# Patient Record
Sex: Male | Born: 2000 | Hispanic: No | Marital: Single | Smoking: Never smoker
Health system: Southern US, Community
[De-identification: ages and names within clinical notes are randomized; demographics above are authoritative.]

## PROBLEM LIST (undated history)

## (undated) DIAGNOSIS — T7840XA Allergy, unspecified, initial encounter: Secondary | ICD-10-CM

## (undated) DIAGNOSIS — S83511A Sprain of anterior cruciate ligament of right knee, initial encounter: Secondary | ICD-10-CM

## (undated) DIAGNOSIS — S83281A Other tear of lateral meniscus, current injury, right knee, initial encounter: Secondary | ICD-10-CM

## (undated) HISTORY — PX: KNEE SURGERY: SHX244

---
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
## 2023-10-21 ENCOUNTER — Emergency Department (HOSPITAL_BASED_OUTPATIENT_CLINIC_OR_DEPARTMENT_OTHER)
Admission: EM | Admit: 2023-10-21 | Discharge: 2023-10-21 | Disposition: A | Discharge status: Home / Self Care | Attending: Emergency Medicine | Admitting: Emergency Medicine

## 2023-10-21 ENCOUNTER — Encounter (HOSPITAL_BASED_OUTPATIENT_CLINIC_OR_DEPARTMENT_OTHER): Payer: Self-pay | Admitting: Emergency Medicine

## 2023-10-21 ENCOUNTER — Other Ambulatory Visit: Payer: Self-pay

## 2023-10-21 DIAGNOSIS — H5789 Other specified disorders of eye and adnexa: Secondary | ICD-10-CM | POA: Diagnosis present

## 2023-10-21 DIAGNOSIS — L249 Irritant contact dermatitis, unspecified cause: Secondary | ICD-10-CM | POA: Insufficient documentation

## 2023-10-21 NOTE — Discharge Instructions (Addendum)
 You were seen in the emergency room today for eye rash.  Please wash your face with gentle soap and water twice a day.  Try to avoid excessive contact with your eye.  You can try a Claritin or Zyrtec during the day for itching or dry skin.  You can try Benadryl in the evening as well.  They should help with allergic type symptoms like dry itchy eyes.  I would also recommend that you use Aquaphor twice daily.  You can also buy over-the-counter hydrocortisone cream which you can use 1-2 times daily for up to 5 days.  Do not use any longer than 5 days.  Follow-up with your primary care doctor and return to emergency room with any new or worsening symptoms.  Follow up with your primary care Doctor, call to schedule an appointment.

## 2023-10-21 NOTE — ED Triage Notes (Signed)
 Pt states he has purple circles under both eyes for 2 months.  He has tried many OTC remedies but nothing is work.  Pt states it seems worse in the am.

## 2023-10-21 NOTE — ED Provider Notes (Signed)
 I provided a substantive portion of the care of this patient.  I personally made/approved the management plan for this patient and take responsibility for the patient management.     Patient has eyelid irritation.  There is no ocular involvement.  Distribution of dryness healing skin is very suggestive of a contact dermatitis.  Patient has involvement of both lids on the right and only the under lid on the left.  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  Prayer service he is rinsing his face 5 times a day with water.  I recommend pain close attention to rubbing the eyes or touching the eyes and avoiding all contact.  He may use a Eucerin barrier cream to protect against overdrying from water.  We will recommend a short course of low-dose hydrocortisone ointment to see if symptoms improve.   Arby Barrette, MD 10/21/23 1325

## 2023-10-21 NOTE — ED Provider Notes (Signed)
 [place] [facility] Provider Note   CSN: [id number] Arrival date & time: 10/21/23  1127     History  Chief Complaint  Patient presents with   Eye Problem    Sean Mills is a 23 y.o. male patient with prior knee surgery and history of allergies presenting to emergency room with eye irritation that has been ongoing for several months.  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  Patient reports that he has not tried any new products.  Reports that he has tried gentle lotion over area of involvement and has tried antihistamine eyedrops.  He reports he has not had significant improvement in symptoms.  He has noted dark discoloration under right eye as well as mild swelling and dry flaky skin.  He denies significant pain or itching associated with the rash.  He has no ocular involvement.  No change in vision no blurry vision.  No eye pain.  No foreign body sensation.  He has not had any discharge from the eye.  He does not wear contacts.    Eye Problem      Home Medications Prior to Admission medications   Medication Sig Start Date End Date Taking? Authorizing Provider  diazepam (VALIUM) 2 MG tablet Take 1 tablet (2 mg total) by mouth every 8 (eight) hours as needed for muscle spasms. 08/13/15   Shepperson, Kirstin, PA-C  oxyCODONE (ROXICODONE) 5 MG immediate release tablet 1 tablets every 4-6 hrs as needed for pain 08/13/15   Shepperson, Kirstin, PA-C      Allergies    Patient has no known allergies.    Review of Systems   Review of Systems  Skin:  Positive for rash.    Physical Exam Updated Vital Signs BP (!) 149/73 (BP Location: Left Arm)   Pulse (!) 49   Temp (!) 97.4 F (36.3 C) (Oral)   Resp 16   Ht 5\' 11"  (1.803 m)   Wt 74.8 kg   SpO2 98%   BMI 23.01 kg/m  Physical Exam Vitals and nursing note reviewed.  Constitutional:      General: He is not in acute distress.    Appearance: He is not toxic-appearing.   HENT:     Head: Normocephalic and atraumatic.  Eyes:     General: No scleral icterus.    Conjunctiva/sclera: Conjunctivae normal.  Cardiovascular:     Rate and Rhythm: Normal rate and regular rhythm.     Pulses: Normal pulses.     Heart sounds: Normal heart sounds.  Pulmonary:     Effort: Pulmonary effort is normal. No respiratory distress.     Breath sounds: Normal breath sounds.  Abdominal:     General: Abdomen is flat. Bowel sounds are normal.     Palpations: Abdomen is soft.     Tenderness: There is no abdominal tenderness.  Skin:    General: Skin is warm and dry.     Findings: No lesion.  Neurological:     General: No focal deficit present.     Mental Status: He is alert and oriented to person, place, and time. Mental status is at baseline.     ED Results / Procedures / Treatments   Labs (all labs ordered are listed, but only abnormal results are displayed) Labs Reviewed - No data to display  EKG None  Radiology No results found.  Procedures Procedures    Medications Ordered in ED Medications - No data to display  ED Course/ Medical Decision Making/ A&P                                 Medical Decision Making  This patient presents to the ED for concern of eye rash, this involves an extensive number of treatment options, and is a complaint that carries with it a high risk of complications and morbidity.  The differential diagnosis includes orbital cellulitis, preorbital cellulitis, conjunctivitis, bacterial conjunctivitis, contact dermatitis, irritant dermatitis     Problem List / ED Course / Critical interventions / Medication management  Patient presenting with rash discoloration and dry skin around eyes.  No orbital involvement.  No change in vision or blurry vision.  No foreign body sensation.  No obvious chemical exposure known irritant.  He has been trying lotion and eyedrops without significant improvement. 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  It feels possible he has had exposure while at his work.  His right eye is worse than his left eye which I feel is likely secondary to being right-hand dominant and frequent contact with the area.  Will have him use gentle soap and water only once daily pat dry and apply Aquaphor twice daily.  Dr. Christie Nottingham evaluated patient and recommending gentle lotion, and frequent washing face and considering trying topical hydrocortisone for very short period of time.  Patient hemodynamically stable and well-appearing.  Do not feel further workup is necessary at this time.  Patient is stable for discharge.  I have reviewed the patients home medicines and have made adjustments as needed   Plan  F/u w/ PCP in 2-3d to ensure resolution of sx.  Patient was given return precautions. Patient stable for discharge at this time.  Patient educated on sx/dx and verbalized understanding of plan. Return to ER w/ new or worsening sx.          Final Clinical Impression(s) / ED Diagnoses Final diagnoses:  Irritant contact dermatitis, unspecified trigger    Rx / DC Orders ED Discharge Orders     None         Smitty Knudsen, PA-C 10/21/23 1422    Arby Barrette, MD 10/21/23 1556
